# Patient Record
Sex: Male | Born: 1965 | Race: Black or African American | Hispanic: No | Marital: Single | Smoking: Former smoker
Health system: Southern US, Community
[De-identification: ages and names within clinical notes are randomized; demographics above are authoritative.]

## PROBLEM LIST (undated history)

## (undated) DIAGNOSIS — E119 Type 2 diabetes mellitus without complications: Secondary | ICD-10-CM

---

## 2016-08-08 ENCOUNTER — Other Ambulatory Visit: Payer: Self-pay

## 2016-08-08 ENCOUNTER — Encounter (HOSPITAL_COMMUNITY): Payer: Self-pay | Admitting: Nurse Practitioner

## 2016-08-08 ENCOUNTER — Emergency Department (HOSPITAL_COMMUNITY)
Admission: EM | Admit: 2016-08-08 | Discharge: 2016-08-08 | Disposition: A | Discharge status: Home / Self Care | Payer: Medicaid Other | Attending: Emergency Medicine | Admitting: Emergency Medicine

## 2016-08-08 DIAGNOSIS — Z87891 Personal history of nicotine dependence: Secondary | ICD-10-CM | POA: Diagnosis not present

## 2016-08-08 DIAGNOSIS — R739 Hyperglycemia, unspecified: Secondary | ICD-10-CM

## 2016-08-08 DIAGNOSIS — E1165 Type 2 diabetes mellitus with hyperglycemia: Secondary | ICD-10-CM | POA: Insufficient documentation

## 2016-08-08 DIAGNOSIS — Z794 Long term (current) use of insulin: Secondary | ICD-10-CM | POA: Insufficient documentation

## 2016-08-08 DIAGNOSIS — E860 Dehydration: Secondary | ICD-10-CM | POA: Diagnosis not present

## 2016-08-08 DIAGNOSIS — R42 Dizziness and giddiness: Secondary | ICD-10-CM | POA: Diagnosis present

## 2016-08-08 HISTORY — DX: Type 2 diabetes mellitus without complications: E11.9

## 2016-08-08 LAB — URINALYSIS, ROUTINE W REFLEX MICROSCOPIC
Bacteria, UA: NONE SEEN
Bilirubin Urine: NEGATIVE
Glucose, UA: >=500 mg/dL — AB
Hgb urine dipstick: NEGATIVE
KETONES UR: NEGATIVE mg/dL
Leukocytes, UA: NEGATIVE
Nitrite: NEGATIVE
PH: 6 (ref 5.0–8.0)
PROTEIN: NEGATIVE mg/dL
SQUAMOUS EPITHELIAL / LPF: NONE SEEN
Specific Gravity, Urine: 1.017 (ref 1.005–1.030)

## 2016-08-08 LAB — CBC
HCT: 34.8 % — ABNORMAL LOW (ref 39.0–52.0)
Hemoglobin: 11.3 g/dL — ABNORMAL LOW (ref 13.0–17.0)
MCH: 25.3 pg — AB (ref 26.0–34.0)
MCHC: 32.5 g/dL (ref 30.0–36.0)
MCV: 77.9 fL — AB (ref 78.0–100.0)
PLATELETS: 223 10*3/uL (ref 150–400)
RBC: 4.47 MIL/uL (ref 4.22–5.81)
RDW: 13.3 % (ref 11.5–15.5)
WBC: 4 10*3/uL (ref 4.0–10.5)

## 2016-08-08 LAB — BASIC METABOLIC PANEL
Anion gap: 10 (ref 5–15)
BUN: 16 mg/dL (ref 6–20)
CALCIUM: 9.2 mg/dL (ref 8.9–10.3)
CHLORIDE: 96 mmol/L — AB (ref 101–111)
CO2: 27 mmol/L (ref 22–32)
CREATININE: 1.13 mg/dL (ref 0.61–1.24)
GFR calc non Af Amer: >60 mL/min (ref >60)
Glucose, Bld: 427 mg/dL — ABNORMAL HIGH (ref 65–99)
Potassium: 4.1 mmol/L (ref 3.5–5.1)
Sodium: 133 mmol/L — ABNORMAL LOW (ref 135–145)

## 2016-08-08 LAB — CBG MONITORING, ED
GLUCOSE-CAPILLARY: 253 mg/dL — AB (ref 65–99)
Glucose-Capillary: 397 mg/dL — ABNORMAL HIGH (ref 65–99)

## 2016-08-08 MED ORDER — LACTATED RINGERS IV BOLUS (SEPSIS)
1000.0000 mL | Freq: Once | INTRAVENOUS | Status: AC
  Administered 2016-08-08: 1000 mL via INTRAVENOUS

## 2016-08-08 NOTE — ED Triage Notes (Signed)
Pt arrived via EMS from home with dizziness x2 days. 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 Now he has random dizzy spells. States he knows he has hx of orthostatic hypotension at times. Also DM which is well controlled per patient. Denies any chest pain, pain at all, actual syncope, or neuro defecits.

## 2016-08-08 NOTE — ED Provider Notes (Signed)
[facility]-EMERGENCY DEPT Provider Note   CSN: [id number] Arrival date & time: 08/08/16  1140     History   Chief Complaint Chief Complaint  Patient presents with  . Dizziness    HPI Brent Fleming is a 51 y.o. male.  51 year old male with past history of diabetes on insulin also orthostatic hypotension presents to the emergency department today with lightheadedness. Patient states that he works outside has been working harder and outside more than normal and he has always had issues with dehydration and he feels that this was happening this time. Over the last 24-36 hours he has had worsening lightheadedness and increased urination as well. Feels that these are related to position changes. There is no chest pain, back pain, shortness of breath, leg pain, leg swelling, palpitations or other symptoms at this time. Other symptoms. No excess removed factors except for movement. No other associated symptoms.    Dizziness    Past Medical History:  Diagnosis Date  . Diabetes mellitus without complication (HCC)     There are no active problems to display for this patient.   No past surgical history on file.     Home Medications    Prior to Admission medications   Not on File    Family History Family History  Problem Relation Age of Onset  . Hyperlipidemia Mother   . Hypertension Mother     Social History Social History  Substance Use Topics  . Smoking status: Former Games developer  . Smokeless tobacco: Never Used  . 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   Review of Systems Review of Systems  Neurological: Positive for dizziness.  All other systems reviewed and are negative.    Physical Exam Updated Vital Signs BP (!) 177/106 (BP Location: Left Arm)   Pulse 90   Temp 97.8 F (36.6 C) (Oral)   Resp 20   Ht 5\' 11"  (1.803 m)   Wt 68 kg (150 lb)   SpO2 100%   BMI 20.92 kg/m     Physical Exam  Constitutional: He is oriented to person, place, and time. He appears well-developed and well-nourished.  HENT:  Head: Normocephalic and atraumatic.  Eyes: Conjunctivae and EOM are normal.  Neck: Normal range of motion.  Cardiovascular: Tachycardia present.   Pulmonary/Chest: Effort normal. No respiratory distress.  Abdominal: Soft. He exhibits no distension.  Musculoskeletal: Normal range of motion.  Neurological: He is alert and oriented to person, place, and time. No cranial nerve deficit. Coordination normal.  Skin: Skin is warm and dry. No rash noted. No erythema.  Nursing note and vitals reviewed.    ED Treatments / Results  Labs (all labs ordered are listed, but only abnormal results are displayed) Labs Reviewed  BASIC METABOLIC PANEL - Abnormal; Notable for the following:       Result Value   Sodium 133 (*)    Chloride 96 (*)    Glucose, Bld 427 (*)    All other components within normal limits  CBC - Abnormal; Notable for the following:    Hemoglobin 11.3 (*)    HCT 34.8 (*)    MCV 77.9 (*)    MCH 25.3 (*)    All other components within normal limits  URINALYSIS, ROUTINE W REFLEX MICROSCOPIC - Abnormal; Notable for the following:    Glucose, UA >=500 (*)    All other  components within normal limits  CBG MONITORING, ED - Abnormal; Notable for the following:    Glucose-Capillary 397 (*)    All other components within normal limits  CBG MONITORING, ED - Abnormal; Notable for the following:    Glucose-Capillary 253 (*)    All other components within normal limits    EKG  EKG Interpretation  Date/Time:  Wednesday August 08 2016 12:01:14 EDT Ventricular Rate:  93 PR Interval:    QRS Duration: 96 QT Interval:  359 QTC Calculation: 447 R Axis:   26 Text Interpretation:  Age not entered, assumed to be  51 years old for purpose of ECG interpretation Sinus rhythm ST elev, probable normal early repol pattern No old tracing to compare Confirmed by  Marily MemosMesner, Diantha Paxson [phone]([id number]) on 08/08/2016 12:22:23 PM       Radiology No results found.  Procedures Procedures (including critical care time)  Medications Ordered in ED Medications  lactated ringers bolus 1,000 mL (0 mLs Intravenous Stopped 08/08/16 1352)  lactated ringers bolus 1,000 mL (0 mLs Intravenous Stopped 08/08/16 1606)     Initial Impression / Assessment and Plan / ED Course  I have reviewed the triage vital signs and the nursing notes.  Pertinent labs & imaging results that were available during my care of the patient were reviewed by me and considered in my medical decision making (see chart for details).    Suspect dehydration. ECG w/ what appears BER. No old tracing to compare but no e/o PE/ACS/arrhythmia as cause for symptoms.  Will check labs. Will hydrate.   After hydration, glucose improving. Patient feels better. Will plan for close pcp follow up, otherwise stable for discharge.   Final Clinical Impressions(s) / ED Diagnoses   Final diagnoses:  Dehydration  Hyperglycemia    New Prescriptions There are no discharge medications for this patient.    Marily MemosMesner, Leslee Haueter, MD 08/09/16 2149

## 2016-08-08 NOTE — ED Notes (Signed)
Per patient he just checked his blood sugar and it was 358 so he took 7 units of Novolog.

## 2016-08-08 NOTE — ED Notes (Signed)
Urine request made 

## 2016-08-08 NOTE — ED Notes (Signed)
2nd request for urine made patient given a urinal

## 2016-08-08 NOTE — ED Notes (Signed)
Bed: RESA Expected date:  Expected time:  Means of arrival:  Comments: EMS near-syncope

## 2016-08-30 ENCOUNTER — Emergency Department (HOSPITAL_COMMUNITY)
Admission: EM | Admit: 2016-08-30 | Discharge: 2016-08-31 | Disposition: A | Discharge status: Home / Self Care | Payer: Medicaid Other | Attending: Emergency Medicine | Admitting: Emergency Medicine

## 2016-08-30 ENCOUNTER — Emergency Department (HOSPITAL_COMMUNITY): Payer: Medicaid Other

## 2016-08-30 ENCOUNTER — Encounter (HOSPITAL_COMMUNITY): Payer: Self-pay | Admitting: Emergency Medicine

## 2016-08-30 DIAGNOSIS — R700 Elevated erythrocyte sedimentation rate: Secondary | ICD-10-CM | POA: Insufficient documentation

## 2016-08-30 DIAGNOSIS — M79672 Pain in left foot: Secondary | ICD-10-CM | POA: Diagnosis present

## 2016-08-30 DIAGNOSIS — E08621 Diabetes mellitus due to underlying condition with foot ulcer: Secondary | ICD-10-CM

## 2016-08-30 DIAGNOSIS — Z794 Long term (current) use of insulin: Secondary | ICD-10-CM | POA: Diagnosis not present

## 2016-08-30 DIAGNOSIS — Z87891 Personal history of nicotine dependence: Secondary | ICD-10-CM | POA: Diagnosis not present

## 2016-08-30 DIAGNOSIS — L97521 Non-pressure chronic ulcer of other part of left foot limited to breakdown of skin: Secondary | ICD-10-CM | POA: Diagnosis not present

## 2016-08-30 LAB — COMPREHENSIVE METABOLIC PANEL
ALK PHOS: 88 U/L (ref 38–126)
ALT: 15 U/L — AB (ref 17–63)
AST: 15 U/L (ref 15–41)
Albumin: 3.6 g/dL (ref 3.5–5.0)
Anion gap: 9 (ref 5–15)
BUN: 19 mg/dL (ref 6–20)
CALCIUM: 9.4 mg/dL (ref 8.9–10.3)
CHLORIDE: 99 mmol/L — AB (ref 101–111)
CO2: 28 mmol/L (ref 22–32)
CREATININE: 1.02 mg/dL (ref 0.61–1.24)
Glucose, Bld: 190 mg/dL — ABNORMAL HIGH (ref 65–99)
Potassium: 4.2 mmol/L (ref 3.5–5.1)
Sodium: 136 mmol/L (ref 135–145)
Total Bilirubin: 0.3 mg/dL (ref 0.3–1.2)
Total Protein: 8.1 g/dL (ref 6.5–8.1)

## 2016-08-30 LAB — CBC WITH DIFFERENTIAL/PLATELET
BASOS ABS: 0 10*3/uL (ref 0.0–0.1)
Basophils Relative: 1 %
Eosinophils Absolute: 0.1 10*3/uL (ref 0.0–0.7)
Eosinophils Relative: 1 %
HEMATOCRIT: 32.8 % — AB (ref 39.0–52.0)
HEMOGLOBIN: 11 g/dL — AB (ref 13.0–17.0)
LYMPHS ABS: 2.4 10*3/uL (ref 0.7–4.0)
LYMPHS PCT: 55 %
MCH: 25.8 pg — AB (ref 26.0–34.0)
MCHC: 33.5 g/dL (ref 30.0–36.0)
MCV: 76.8 fL — AB (ref 78.0–100.0)
Monocytes Absolute: 0.4 10*3/uL (ref 0.1–1.0)
Monocytes Relative: 9 %
NEUTROS ABS: 1.5 10*3/uL — AB (ref 1.7–7.7)
NEUTROS PCT: 34 %
PLATELETS: 218 10*3/uL (ref 150–400)
RBC: 4.27 MIL/uL (ref 4.22–5.81)
RDW: 12.9 % (ref 11.5–15.5)
WBC: 4.4 10*3/uL (ref 4.0–10.5)

## 2016-08-30 LAB — URINALYSIS, ROUTINE W REFLEX MICROSCOPIC
BILIRUBIN URINE: NEGATIVE
Glucose, UA: 50 mg/dL — AB
HGB URINE DIPSTICK: NEGATIVE
KETONES UR: NEGATIVE mg/dL
Leukocytes, UA: NEGATIVE
NITRITE: NEGATIVE
PH: 6 (ref 5.0–8.0)
Protein, ur: NEGATIVE mg/dL
SPECIFIC GRAVITY, URINE: 1.016 (ref 1.005–1.030)

## 2016-08-30 LAB — I-STAT CG4 LACTIC ACID, ED: LACTIC ACID, VENOUS: 0.63 mmol/L (ref 0.5–1.9)

## 2016-08-30 LAB — CBG MONITORING, ED: GLUCOSE-CAPILLARY: 218 mg/dL — AB (ref 65–99)

## 2016-08-30 NOTE — ED Triage Notes (Signed)
Patient reports that he gets recurrent calluses on bottom of left foot. About 4 days ago wearing dress sock and rubbing on lateral side of left foot causing pus blister that has since popped.  Patient reports that callus skin has grown over the pus blister and feels that dead skin needs to be removed and pus drained.  Patient reports that he does has some drainage when he wears a shoe.

## 2016-08-30 NOTE — ED Notes (Signed)
Patient aware of urine sample. 

## 2016-08-30 NOTE — ED Provider Notes (Signed)
[facility]-EMERGENCY DEPT Provider Note   CSN: [id number] Arrival date & time: 08/30/16  1802     History   Chief Complaint Chief Complaint  Patient presents with  . possible foot infection    HPI Brent Fleming is a 51 y.o. male with a hx of IDDM (poorly managed despite medication) presents to the Emergency Department complaining of gradual, persistent, progressively worsening pain and swelling at the site of a callus present for months.  Pt reports 4 days ago he developed a blister at the site that ruptured and drained purulent fluid.   Associated symptoms include persistent bleeding at the site.  Pt reports he normally keeps the callus pared down.  Nothing makes it better and nothing makes it worse.  Pt denies pain at the site.  Pt denies fever, chills, headache, neck pain, chest pain, SOB, abd pain, N/V/D, weakness, dizziness, syncope.   CBG runs between 100-300 normally.  Does not have a PCP or podiatrist.     The history is provided by the patient and medical records. No language interpreter was used.    Past Medical History:  Diagnosis Date  . Diabetes mellitus without complication (HCC)     There are no active problems to display for this patient.   History reviewed. No pertinent surgical history.     Home Medications    Prior to Admission medications   Medication Sig Start Date End Date Taking? Authorizing Provider  ibuprofen (ADVIL,MOTRIN) 200 MG tablet Take 200 mg by mouth every 6 (six) hours as needed for headache or mild pain.   Yes [provider]  LANTUS SOLOSTAR 100 UNIT/ML Solostar Pen Inject 32 micro curies into the skin daily. 07/10/16  Yes [provider]  NOVOLOG FLEXPEN 100 UNIT/ML FlexPen Inject 0-10 Units into the skin 3 (three) times daily. Sliding scale 07/10/16  Yes [provider]  cephALEXin (KEFLEX) 500 MG capsule Take 1 capsule (500 mg total) by mouth 4 (four) times daily. 08/31/16   Johnpaul Gillentine, Dahlia Client, PA-C    Family  History Family History  Problem Relation Age of Onset  . Hyperlipidemia Mother   . Hypertension Mother     Social History Social History  Substance Use Topics  . Smoking status: Former Games developer  . Smokeless tobacco: Never Used  . 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   Review of Systems Review of Systems  Constitutional: Negative for appetite change, diaphoresis, fatigue, fever and unexpected weight change.  HENT: Negative for mouth sores.   Eyes: Negative for visual disturbance.  Respiratory: Negative for cough, chest tightness, shortness of breath and wheezing.   Cardiovascular: Negative for chest pain.  Gastrointestinal: Negative for abdominal pain, constipation, diarrhea, nausea and vomiting.  Endocrine: Negative for polydipsia, polyphagia and polyuria.  Genitourinary: Negative for dysuria, frequency, hematuria and urgency.  Musculoskeletal: Negative for back pain and neck stiffness.  Skin: Positive for wound. Negative for rash.  Allergic/Immunologic: Negative for immunocompromised state.  Neurological: Negative for syncope, light-headedness and headaches.  Hematological: Does not bruise/bleed easily.  Psychiatric/Behavioral: Negative for sleep disturbance. The patient is not nervous/anxious.   All other systems reviewed and are negative.    Physical Exam Updated Vital Signs BP (!) 133/94 (BP Location: Left Arm)   Pulse 95   Temp 98.2 F (36.8 C) (Oral)   Resp 20   Ht 5\' 11"  (1.803 m)  Wt 70.3 kg (155 lb)   SpO2 100%   BMI 21.62 kg/m   Physical Exam  Constitutional: He appears well-developed and well-nourished. No distress.  Awake, alert, nontoxic appearance  HENT:  Head: Normocephalic and atraumatic.  Mouth/Throat: Oropharynx is clear and moist. No oropharyngeal exudate.  Eyes: Conjunctivae are normal. No scleral icterus.  Neck: Normal range of motion. Neck supple.    Cardiovascular: Normal rate, regular rhythm and intact distal pulses.   Pulmonary/Chest: Effort normal and breath sounds normal. No respiratory distress. He has no wheezes.  Equal chest expansion  Abdominal: Soft. Bowel sounds are normal. He exhibits no mass. There is no tenderness. There is no rebound and no guarding.  Musculoskeletal: Normal range of motion. He exhibits no edema.  Left lateral foot with ulceration and large area of dead skin; oozing blood and some purulent drainage.  Neurological: He is alert.  Speech is clear and goal oriented Moves extremities without ataxia  Skin: Skin is warm and dry. He is not diaphoretic.  Psychiatric: He has a normal mood and affect.  Nursing note and vitals reviewed.    ED Treatments / Results  Labs (all labs ordered are listed, but only abnormal results are displayed) Labs Reviewed  CBC WITH DIFFERENTIAL/PLATELET - Abnormal; Notable for the following:       Result Value   Hemoglobin 11.0 (*)    HCT 32.8 (*)    MCV 76.8 (*)    MCH 25.8 (*)    Neutro Abs 1.5 (*)    All other components within normal limits  COMPREHENSIVE METABOLIC PANEL - Abnormal; Notable for the following:    Chloride 99 (*)    Glucose, Bld 190 (*)    ALT 15 (*)    All other components within normal limits  SEDIMENTATION RATE - Abnormal; Notable for the following:    Sed Rate 52 (*)    All other components within normal limits  URINALYSIS, ROUTINE W REFLEX MICROSCOPIC - Abnormal; Notable for the following:    Glucose, UA 50 (*)    All other components within normal limits  CBG MONITORING, ED - Abnormal; Notable for the following:    Glucose-Capillary 218 (*)    All other components within normal limits  I-STAT CG4 LACTIC ACID, ED    Radiology Dg Foot Complete Left  Result Date: 08/30/2016 CLINICAL DATA:  Left foot ulceration EXAM: LEFT FOOT - COMPLETE 3+ VIEW COMPARISON:  None. FINDINGS: There is soft tissue ulceration at the lateral surface of the left  foot, nearest the fifth metatarsophalangeal joint. There is no osteolysis or other acute osseous abnormality. IMPRESSION: Soft tissue ulcer at the lateral aspect of the left foot near the fifth metatarsophalangeal joint without evidence of osteomyelitis. Electronically Signed   By: Deatra RobinsonKevin  Herman M.D.   On: 08/30/2016 23:15    Procedures Debridement Date/Time: 08/31/2016 12:00 AM Performed by: Dierdre ForthMUTHERSBAUGH, Amen Staszak Authorized by: Dierdre ForthMUTHERSBAUGH, Leyland Kenna  Consent: Verbal consent obtained. Risks and benefits: risks, benefits and alternatives were discussed Consent given by: patient Site marked: the operative site was marked Imaging studies: imaging studies available Required items: required blood products, implants, devices, and special equipment available Patient identity confirmed: verbally with patient and arm band Time out: Immediately prior to procedure a "time out" was called to verify the correct patient, procedure, equipment, support staff and site/side marked as required. Preparation: Patient was prepped and draped in the usual sterile fashion. Local anesthesia used: no  Anesthesia: Local anesthesia used: no  Sedation: Patient sedated: no  Patient tolerance: Patient tolerated the procedure well with no immediate complications Comments: Minor debridement of the diabetic ulcer to the left lateral and plantar surface of the distal foot.    (including critical care time)  Medications Ordered in ED Medications - No data to display   Initial Impression / Assessment and Plan / ED Course  I have reviewed the triage vital signs and the nursing notes.  Pertinent labs & imaging results that were available during my care of the patient were reviewed by me and considered in my medical decision making (see chart for details).     Pt presents with diabetic foot wound.  He demands that it be debrided. He has no systemic symptoms of infection.  Vital signs without fever, tachycardia or  hypotension.  No leukocytosis or elevated lactic acid.  CBG elevated at 218.  Slightly elevated Sed rate.  UA without ketones.  Normal AG, no evidence of DKA.  Doubt osteomyelitis.  Pt will be d/c home with foot care instructions, keflex and referral to podiatry.  Also discussed reasons to return to the ED.  Pt states understanding and is in agreement with the plan.    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     Jayshawn Colston, Boyd Kerbs 08/31/16 0024    Little, Ambrose Finland, MD 08/31/16 [phone]

## 2016-08-31 LAB — SEDIMENTATION RATE: Sed Rate: 52 mm/hr — ABNORMAL HIGH (ref 0–16)

## 2016-08-31 MED ORDER — CEPHALEXIN 500 MG PO CAPS: 500.0000 mg | ORAL_CAPSULE | Freq: Four times a day (QID) | ORAL | 0 refills | Status: DC

## 2016-08-31 NOTE — Discharge Instructions (Signed)
1. Medications: Keflex, usual home medications 2. Treatment: rest, drink plenty of fluids,  3. Follow Up: Please followup with podiatry in 3 days for discussion of your diagnoses and further evaluation after today's visit; if you do not have a primary care doctor use the resource guide provided to find one; Please return to the ER for worsening infection, fever, worsening drainage or other concerns

## 2016-09-03 ENCOUNTER — Ambulatory Visit (INDEPENDENT_AMBULATORY_CARE_PROVIDER_SITE_OTHER): Payer: Medicaid Other | Admitting: Podiatry

## 2016-09-03 DIAGNOSIS — E0842 Diabetes mellitus due to underlying condition with diabetic polyneuropathy: Secondary | ICD-10-CM

## 2016-09-03 DIAGNOSIS — L97522 Non-pressure chronic ulcer of other part of left foot with fat layer exposed: Secondary | ICD-10-CM | POA: Diagnosis not present

## 2016-09-03 DIAGNOSIS — I70245 Atherosclerosis of native arteries of left leg with ulceration of other part of foot: Secondary | ICD-10-CM | POA: Diagnosis not present

## 2016-09-03 MED ORDER — GENTAMICIN SULFATE 0.1 % EX CREA: 1.0000 "application " | TOPICAL_CREAM | Freq: Three times a day (TID) | CUTANEOUS | 0 refills | Status: AC

## 2016-09-03 MED ORDER — SULFAMETHOXAZOLE-TRIMETHOPRIM 800-160 MG PO TABS: 1.0000 | ORAL_TABLET | Freq: Two times a day (BID) | ORAL | 0 refills | Status: DC

## 2016-09-03 NOTE — Progress Notes (Signed)
Patient ID: Brent Fleming, male   DOB: [birth year], 51 y.o.   MRN: [id number]   Subjective:  Patient with a history of diabetes mellitus presents today for evaluation of a wound to the plantar aspect of the left foot. The wound is been going on for proximal he 1 week now. Patient states that he worsened tightfitting shoes and developed a blister on the outside of his foot. The blister eventually turned into an ulcer and he presented to the emergency department and was referred here for follow-up treatment and evaluation. X-rays performed in the emergency department were negative for any obvious sign of osteomyelitis.   Objective/Physical Exam General: The patient is alert and oriented x3 in no acute distress.  Dermatology:  Wound #1 noted to the sub-fifth MPJ left foot measuring approximately 2.02.00.2 cm (LxWxD).   To the noted ulceration(s), there is no eschar. There is a moderate amount of slough, fibrin, and necrotic tissue noted. Granulation tissue and wound base is red. There is a minimal amount of serosanguineous drainage noted. There is no exposed bone muscle-tendon ligament or joint. There is no malodor. Periwound integrity is macerated. Skin is warm, dry and supple bilateral lower extremities.  Vascular: Palpable pedal pulses bilaterally. No edema or erythema noted. Capillary refill within normal limits.  Neurological: Epicritic and protective threshold absent bilaterally.   Musculoskeletal Exam: Range of motion within normal limits to all pedal and ankle joints bilateral. Muscle strength 5/5 in all groups bilateral.   Assessment: #1 left plantar forefoot ulceration secondary to diabetes mellitus #2 diabetes mellitus w/ peripheral neuropathy   Plan of Care:  #1 Patient was evaluated. #2 medically necessary excisional debridement including muscle and deep fascial tissue was performed using a tissue nipper and a chisel blade. Excisional debridement of all the necrotic nonviable  tissue down to healthy bleeding viable tissue was performed with post-debridement measurements same as pre-. #3 the wound was cleansed and dry sterile dressing applied. #4 today cultures were taken and sent to pathology for culture and sensitivity #5 prescription for Bactrim DS #6 prescription for gentamicin cream #7 return to clinic in 2 weeks   Felecia ShellingBrent M. Zeek Rostron, DPM [facility]  Dr. Felecia ShellingBrent M. Allura Doepke, DPM    [address]                                        [place], [place] [address]                Office [phone]  Fax [phone]

## 2016-09-03 NOTE — Progress Notes (Signed)
   Subjective:    Patient ID: Brent Fleming, male    DOB: 19-Oct-1965, 51 y.o.   MRN: [id number]  HPI  I have had a callus for years it became a blister that busted about a week ago and now I have a wound.  I went to the ER on Thursday they did x-rays and started me on antibiotics.  I have been cleaning with peroxide, dressing and using a shoe from a previous occurecnce  I am diabetic.    Review of Systems  All other systems reviewed and are negative.      Objective:   Physical Exam        Assessment & Plan:

## 2016-09-06 LAB — WOUND CULTURE
GRAM STAIN: NONE SEEN
GRAM STAIN: NONE SEEN
Gram Stain: NONE SEEN

## 2016-09-17 ENCOUNTER — Ambulatory Visit (INDEPENDENT_AMBULATORY_CARE_PROVIDER_SITE_OTHER): Payer: Medicaid Other | Admitting: Podiatry

## 2016-09-17 DIAGNOSIS — E0842 Diabetes mellitus due to underlying condition with diabetic polyneuropathy: Secondary | ICD-10-CM

## 2016-09-17 DIAGNOSIS — I70245 Atherosclerosis of native arteries of left leg with ulceration of other part of foot: Secondary | ICD-10-CM | POA: Diagnosis not present

## 2016-09-17 DIAGNOSIS — L97522 Non-pressure chronic ulcer of other part of left foot with fat layer exposed: Secondary | ICD-10-CM | POA: Diagnosis not present

## 2016-09-17 NOTE — Progress Notes (Signed)
Patient ID: Brent KanarisWillie Bertsch, male   DOB: [birth year], 51 y.o.   MRN: [id number]   Subjective:  Patient with a history of diabetes mellitus presents today for follow-up evaluation of an ulcer to the left plantar forefoot. Patient believes the ulcer is doing much better. He still has proximal 1 day worth of antibiotics. He is been dressing the ulcer daily and wearing the offloading Darco shoe.   Objective/Physical Exam General: The patient is alert and oriented x3 in no acute distress.  Dermatology:  Wound #1 noted to the sub-fifth MPJ left foot measuring approximately 1.01.00.1 cm (LxWxD).   To the noted ulceration(s), there is no eschar. There is a moderate amount of slough, fibrin, and necrotic tissue noted. Granulation tissue and wound base is red. There is a minimal amount of serosanguineous drainage noted. There is no exposed bone muscle-tendon ligament or joint. There is no malodor. Periwound integrity is macerated. Skin is warm, dry and supple bilateral lower extremities.  Vascular: Palpable pedal pulses bilaterally. No edema or erythema noted. Capillary refill within normal limits.  Neurological: Epicritic and protective threshold absent bilaterally.   Musculoskeletal Exam: Range of motion within normal limits to all pedal and ankle joints bilateral. Muscle strength 5/5 in all groups bilateral.   Assessment: #1 left plantar forefoot ulceration secondary to diabetes mellitus #2 diabetes mellitus w/ peripheral neuropathy   Plan of Care:  #1 Patient was evaluated. Culture results taken last visit were reviewed today. Negative for infection. #2 medically necessary excisional debridement including subcutaneous tissue was performed using a tissue nipper and a chisel blade. Excisional debridement of all the necrotic nonviable tissue down to healthy bleeding viable tissue was performed with post-debridement measurements same as pre-. #3 the wound was cleansed and dry sterile dressing  applied. #4 continue daily application of gentamicin cream with dry sterile dressing #5 continue offloading Darco shoe #6 return to clinic in 2 weeks   Felecia ShellingBrent M. Findlay Dagher, DPM [facility]  Dr. Felecia ShellingBrent M. Briea Mcenery, DPM    [address]                                        [place], [place] [address]                Office [phone]  Fax [phone]

## 2016-09-24 ENCOUNTER — Ambulatory Visit (HOSPITAL_COMMUNITY)
Admission: EM | Admit: 2016-09-24 | Discharge: 2016-09-24 | Disposition: A | Discharge status: Home / Self Care | Payer: Medicaid Other | Attending: Emergency Medicine | Admitting: Emergency Medicine

## 2016-09-24 ENCOUNTER — Encounter (HOSPITAL_COMMUNITY): Payer: Self-pay | Admitting: *Deleted

## 2016-09-24 DIAGNOSIS — S91209A Unspecified open wound of unspecified toe(s) with damage to nail, initial encounter: Secondary | ICD-10-CM

## 2016-09-24 DIAGNOSIS — S91201A Unspecified open wound of right great toe with damage to nail, initial encounter: Secondary | ICD-10-CM | POA: Diagnosis not present

## 2016-09-24 DIAGNOSIS — S90122A Contusion of left lesser toe(s) without damage to nail, initial encounter: Secondary | ICD-10-CM | POA: Diagnosis not present

## 2016-09-24 MED ORDER — CEPHALEXIN 500 MG PO CAPS: 500.0000 mg | ORAL_CAPSULE | Freq: Four times a day (QID) | ORAL | 0 refills | Status: AC

## 2016-09-24 NOTE — Discharge Instructions (Signed)
Keep the bed of the toenail clean. You may soak in warm salt water or irrigate with water from the tub. Watch for any signs of infection such as increased redness, swelling, drainage of pus. Apply your gentamicin ointment for the next couple of days. After that he will need to apply. Allow it to dry. Keep it covered so it will not get dirty or infected. Take the antibiotic for the first 2 days for prevention of infection since you have diabetes. May also apply ice to the toe as needed for swelling.

## 2016-09-24 NOTE — ED Provider Notes (Signed)
[facility]    CSN: [id number] Arrival date & time: 09/24/16  1909     History   Chief Complaint Chief Complaint  Patient presents with  . Toe Injury    HPI Brent Fleming is a 51 y.o. male.   51 year old male stubbed her left toe on the bed foot approximately one hour prior to arrival. This produced an approximately 90% nail avulsion. There is minor swelling to the distal phalanx. Minor tenderness.      Past Medical History:  Diagnosis Date  . Diabetes mellitus without complication (HCC)     There are no active problems to display for this patient.   History reviewed. No pertinent surgical history.     Home Medications    Prior to Admission medications   Medication Sig Start Date End Date Taking? Authorizing Provider  cephALEXin (KEFLEX) 500 MG capsule Take 1 capsule (500 mg total) by mouth 4 (four) times daily. 09/24/16   Hayden RasmussenMabe, Detric Scalisi, NP  gentamicin cream (GARAMYCIN) 0.1 % Apply 1 application topically 3 (three) times daily. 09/03/16   Felecia ShellingEvans, Brent M, DPM  ibuprofen (ADVIL,MOTRIN) 200 MG tablet Take 200 mg by mouth every 6 (six) hours as needed for headache or mild pain.    [provider]  LANTUS SOLOSTAR 100 UNIT/ML Solostar Pen Inject 32 micro curies into the skin daily. 07/10/16   [provider]  NOVOLOG FLEXPEN 100 UNIT/ML FlexPen Inject 0-10 Units into the skin 3 (three) times daily. Sliding scale 07/10/16   [provider]    Family History Family History  Problem Relation Age of Onset  . Hyperlipidemia Mother   . Hypertension Mother     Social History Social History  Substance Use Topics  . Smoking status: Former Games developermoker  . Smokeless tobacco: Never Used  . 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   Review of Systems Review of Systems  Constitutional: Negative.   Skin: Positive for wound.  Neurological: Negative.     All other systems reviewed and are negative.    Physical Exam Triage Vital Signs ED Triage Vitals [09/24/16 2003]  Enc Vitals Group     BP (!) 154/60     Pulse Rate 78     Resp 18     Temp 98.6 F (37 C)     Temp Source Oral     SpO2 100 %     Weight      Height      Head Circumference      Peak Flow      Pain Score 2     Pain Loc      Pain Edu?      Excl. in GC?    No data found.   Updated Vital Signs BP (!) 154/60 (BP Location: Right Arm)   Pulse 78   Temp 98.6 F (37 C) (Oral)   Resp 18   SpO2 100%   Visual Acuity Right Eye Distance:   Left Eye Distance:   Bilateral Distance:    Right Eye Near:   Left Eye Near:    Bilateral Near:     Physical Exam  Constitutional: He is oriented to person, place, and time. He appears well-developed and well-nourished. No distress.  Eyes: EOM are normal.  Neck: Neck supple.  Cardiovascular: Normal rate.   Pulmonary/Chest: Effort normal. No respiratory distress.  Musculoskeletal:  He exhibits no edema or deformity.  The left second toe with near complete nail avulsion. The underlying bed intact, no laceration. The patient preferred to pull remainder of the nail off himself. No joint tenderness to the toe. No deformity. Distal neurovascular motor sensory is grossly intact.  Neurological: He is alert and oriented to person, place, and time. He exhibits normal muscle tone.  Skin: Skin is warm and dry.  Psychiatric: He has a normal mood and affect.  Nursing note and vitals reviewed.    UC Treatments / Results  Labs (all labs ordered are listed, but only abnormal results are displayed) Labs Reviewed - No data to display  EKG  EKG Interpretation None       Radiology No results found.  Procedures Procedures (including critical care time)  Medications Ordered in UC Medications - No data to display   Initial Impression / Assessment and Plan / UC Course  I have reviewed the triage vital signs and the nursing  notes.  Pertinent labs & imaging results that were available during my care of the patient were reviewed by me and considered in my medical decision making (see chart for details).     Keep the bed of the toenail clean. You may soak in warm salt water or irrigate with water from the tub. Watch for any signs of infection such as increased redness, swelling, drainage of pus. Apply your gentamicin ointment for the next couple of days. After that he will need to apply. Allow it to dry. Keep it covered so it will not get dirty or infected. Take the antibiotic for the first 2 days for prevention of infection since you have diabetes. May also apply ice to the toe as needed for swelling.   Final Clinical Impressions(s) / UC Diagnoses   Final diagnoses:  Contusion of second toe of left foot, initial encounter  Traumatic avulsion of nail plate of toe, initial encounter    New Prescriptions New Prescriptions   CEPHALEXIN (KEFLEX) 500 MG CAPSULE    Take 1 capsule (500 mg total) by mouth 4 (four) times daily.     Controlled Substance Prescriptions [place] Controlled Substance Registry consulted? Not Applicable   Hayden Rasmussen, NP 09/24/16 2045
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
## 2016-09-24 NOTE — ED Notes (Signed)
Foot cleaned and bandaid placed. Instructions given for home care by NP Mabe.

## 2016-10-01 ENCOUNTER — Ambulatory Visit (INDEPENDENT_AMBULATORY_CARE_PROVIDER_SITE_OTHER): Payer: Medicaid Other | Admitting: Podiatry

## 2016-10-01 ENCOUNTER — Encounter: Payer: Self-pay | Admitting: Podiatry

## 2016-10-01 DIAGNOSIS — I70245 Atherosclerosis of native arteries of left leg with ulceration of other part of foot: Secondary | ICD-10-CM

## 2016-10-01 DIAGNOSIS — E0842 Diabetes mellitus due to underlying condition with diabetic polyneuropathy: Secondary | ICD-10-CM

## 2016-10-01 DIAGNOSIS — L97522 Non-pressure chronic ulcer of other part of left foot with fat layer exposed: Secondary | ICD-10-CM

## 2016-10-01 NOTE — Progress Notes (Signed)
Patient ID: Brent Fleming, male   DOB: 1965/07/14, 51 y.o.   MRN: [id number]   Subjective:  Patient with a history of diabetes mellitus presents today for follow-up evaluation of an ulcer to the left plantar forefoot. Patient states that the ulcer continues to improve. He finished his oral antibiotics. He has been applying gentamicin cream and dressing the ulcer daily and wearing offloading Darco shoe.   Objective/Physical Exam General: The patient is alert and oriented x3 in no acute distress.  Dermatology:  Wound #1 noted to the sub-fifth MPJ left foot measuring approximately 0.60.7 0.1 cm (LxWxD).   To the noted ulceration(s), there is no eschar. There is a moderate amount of slough, fibrin, and necrotic tissue noted. Granulation tissue and wound base is red. There is a minimal amount of serosanguineous drainage noted. There is no exposed bone muscle-tendon ligament or joint. There is no malodor. Periwound integrity is macerated. Skin is warm, dry and supple bilateral lower extremities.  Vascular: Palpable pedal pulses bilaterally. No edema or erythema noted. Capillary refill within normal limits.  Neurological: Epicritic and protective threshold absent bilaterally.   Musculoskeletal Exam: Range of motion within normal limits to all pedal and ankle joints bilateral. Muscle strength 5/5 in all groups bilateral.   Assessment: #1 left plantar forefoot ulceration secondary to diabetes mellitus #2 diabetes mellitus w/ peripheral neuropathy   Plan of Care:  #1 Patient was evaluated.  #2 medically necessary excisional debridement including subcutaneous tissue was performed using a tissue nipper and a chisel blade. Excisional debridement of all the necrotic nonviable tissue down to healthy bleeding viable tissue was performed with post-debridement measurements same as pre-. #3 the wound was cleansed and dry sterile dressing applied. #4 continue daily application of gentamicin cream with dry  sterile dressing #5 continue offloading Darco shoe. Offloading donut pads were dispensed today #6 return to clinic in 2 weeks   Felecia Shelling, DPM [facility]  Dr. Felecia Shelling, DPM    [address]                                        [place], [place] [address]                Office [phone]  Fax [phone]

## 2016-10-22 ENCOUNTER — Ambulatory Visit (INDEPENDENT_AMBULATORY_CARE_PROVIDER_SITE_OTHER): Payer: Medicaid Other | Admitting: Podiatry

## 2016-10-22 ENCOUNTER — Encounter: Payer: Self-pay | Admitting: Podiatry

## 2016-10-22 DIAGNOSIS — L97522 Non-pressure chronic ulcer of other part of left foot with fat layer exposed: Secondary | ICD-10-CM

## 2016-10-22 DIAGNOSIS — I70245 Atherosclerosis of native arteries of left leg with ulceration of other part of foot: Secondary | ICD-10-CM

## 2016-10-22 DIAGNOSIS — E0842 Diabetes mellitus due to underlying condition with diabetic polyneuropathy: Secondary | ICD-10-CM

## 2016-10-26 NOTE — Progress Notes (Signed)
   HPI:   Past Medical History:  Diagnosis Date  . Diabetes mellitus without complication [facility])      Physical Exam: General: The patient is alert and oriented x3 in no acute distress.  Dermatology: Skin is warm, dry and supple bilateral lower extremities. Negative for open lesions or macerations.The ulceration site previously mentioned on the left plantar forefoot appears to be healed. There is some hyperkeratotic callus tissue noted however complete reepithelialization has occurred.  Vascular: Palpable pedal pulses bilaterally. No edema or erythema noted. Capillary refill within normal limits.  Neurological: Epicritic and protective threshold grossly intact bilaterally.   Musculoskeletal Exam: Range of motion within normal limits to all pedal and ankle joints bilateral. Muscle strength 5/5 in all groups bilateral.    Assessment: -  Healed left plantar forefoot ulceration secondary to diabetes mellitus - Pre-ulcerative callus lesion left   Plan of Care:  - Patient was evaluated today - Excisional debridement of the callus lesion was performed using a chisel blade without incident or bleeding - Recommended the patient were good supportive shoe gear -  Stressed the importance of maintaining healthy blood sugar levels and controlling diabetes -Return to clinic when necessary  Felecia Shelling, DPM [facility]  Dr. Felecia Shelling, DPM    [address] [place], [place] [address]                Office [phone]  Fax [phone]    She is trying red but

## 2016-12-06 ENCOUNTER — Emergency Department (HOSPITAL_COMMUNITY)
Admission: EM | Admit: 2016-12-06 | Discharge: 2016-12-06 | Disposition: A | Discharge status: Home / Self Care | Payer: No Typology Code available for payment source | Attending: Emergency Medicine | Admitting: Emergency Medicine

## 2016-12-06 ENCOUNTER — Encounter (HOSPITAL_COMMUNITY): Payer: Self-pay

## 2016-12-06 DIAGNOSIS — M7918 Myalgia, other site: Secondary | ICD-10-CM

## 2016-12-06 DIAGNOSIS — Y9389 Activity, other specified: Secondary | ICD-10-CM | POA: Insufficient documentation

## 2016-12-06 DIAGNOSIS — E119 Type 2 diabetes mellitus without complications: Secondary | ICD-10-CM | POA: Insufficient documentation

## 2016-12-06 DIAGNOSIS — M62830 Muscle spasm of back: Secondary | ICD-10-CM | POA: Diagnosis not present

## 2016-12-06 DIAGNOSIS — Y999 Unspecified external cause status: Secondary | ICD-10-CM | POA: Diagnosis not present

## 2016-12-06 DIAGNOSIS — Z79899 Other long term (current) drug therapy: Secondary | ICD-10-CM | POA: Insufficient documentation

## 2016-12-06 DIAGNOSIS — Z794 Long term (current) use of insulin: Secondary | ICD-10-CM | POA: Diagnosis not present

## 2016-12-06 DIAGNOSIS — Z87891 Personal history of nicotine dependence: Secondary | ICD-10-CM | POA: Diagnosis not present

## 2016-12-06 DIAGNOSIS — Y92410 Unspecified street and highway as the place of occurrence of the external cause: Secondary | ICD-10-CM | POA: Insufficient documentation

## 2016-12-06 DIAGNOSIS — M545 Low back pain: Secondary | ICD-10-CM | POA: Diagnosis present

## 2016-12-06 MED ORDER — IBUPROFEN 600 MG PO TABS: 600.0000 mg | ORAL_TABLET | Freq: Four times a day (QID) | ORAL | 0 refills | Status: AC | PRN

## 2016-12-06 MED ORDER — IBUPROFEN 200 MG PO TABS
600.0000 mg | ORAL_TABLET | Freq: Once | ORAL | Status: AC
  Administered 2016-12-06: 600 mg via ORAL
  Filled 2016-12-06: qty 3

## 2016-12-06 MED ORDER — CYCLOBENZAPRINE HCL 10 MG PO TABS
5.0000 mg | ORAL_TABLET | Freq: Once | ORAL | Status: AC
  Administered 2016-12-06: 5 mg via ORAL
  Filled 2016-12-06: qty 1

## 2016-12-06 MED ORDER — CYCLOBENZAPRINE HCL 10 MG PO TABS: 10.0000 mg | ORAL_TABLET | Freq: Two times a day (BID) | ORAL | 0 refills | Status: AC | PRN
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
## 2016-12-27 ENCOUNTER — Other Ambulatory Visit: Payer: Self-pay

## 2016-12-27 ENCOUNTER — Emergency Department (HOSPITAL_COMMUNITY)
Admission: EM | Admit: 2016-12-27 | Discharge: 2016-12-27 | Disposition: A | Discharge status: Home / Self Care | Payer: Medicaid Other | Attending: Physician Assistant | Admitting: Physician Assistant

## 2016-12-27 ENCOUNTER — Encounter (HOSPITAL_COMMUNITY): Payer: Self-pay | Admitting: *Deleted

## 2016-12-27 DIAGNOSIS — Y998 Other external cause status: Secondary | ICD-10-CM | POA: Diagnosis not present

## 2016-12-27 DIAGNOSIS — Z794 Long term (current) use of insulin: Secondary | ICD-10-CM | POA: Insufficient documentation

## 2016-12-27 DIAGNOSIS — Y929 Unspecified place or not applicable: Secondary | ICD-10-CM | POA: Insufficient documentation

## 2016-12-27 DIAGNOSIS — S0993XA Unspecified injury of face, initial encounter: Secondary | ICD-10-CM | POA: Insufficient documentation

## 2016-12-27 DIAGNOSIS — Y9389 Activity, other specified: Secondary | ICD-10-CM | POA: Diagnosis not present

## 2016-12-27 DIAGNOSIS — E119 Type 2 diabetes mellitus without complications: Secondary | ICD-10-CM | POA: Diagnosis not present

## 2016-12-27 DIAGNOSIS — Z87891 Personal history of nicotine dependence: Secondary | ICD-10-CM | POA: Insufficient documentation

## 2016-12-27 MED ORDER — ACETAMINOPHEN 500 MG PO TABS
1000.0000 mg | ORAL_TABLET | Freq: Once | ORAL | Status: AC
  Administered 2016-12-27: 1000 mg via ORAL
  Filled 2016-12-27: qty 2

## 2016-12-27 NOTE — ED Notes (Signed)
Pt verbalized understanding of discharge instructions and denies any further questions at this time.    Pt understands that he should go directly to dentist office

## 2016-12-27 NOTE — Discharge Instructions (Signed)
Go to the dentist office straight from the ER so they have time to see you before the office closes.
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
## 2016-12-27 NOTE — ED Notes (Signed)
ED Provider at bedside. 

## 2018-03-25 IMAGING — CR DG FOOT COMPLETE 3+V*L*
3 series · 3 of 3 positions shown · non-contrast
Comparison: None.

CLINICAL DATA: Left foot ulceration

EXAM:
LEFT FOOT - COMPLETE 3+ VIEW

[x foot ap left]
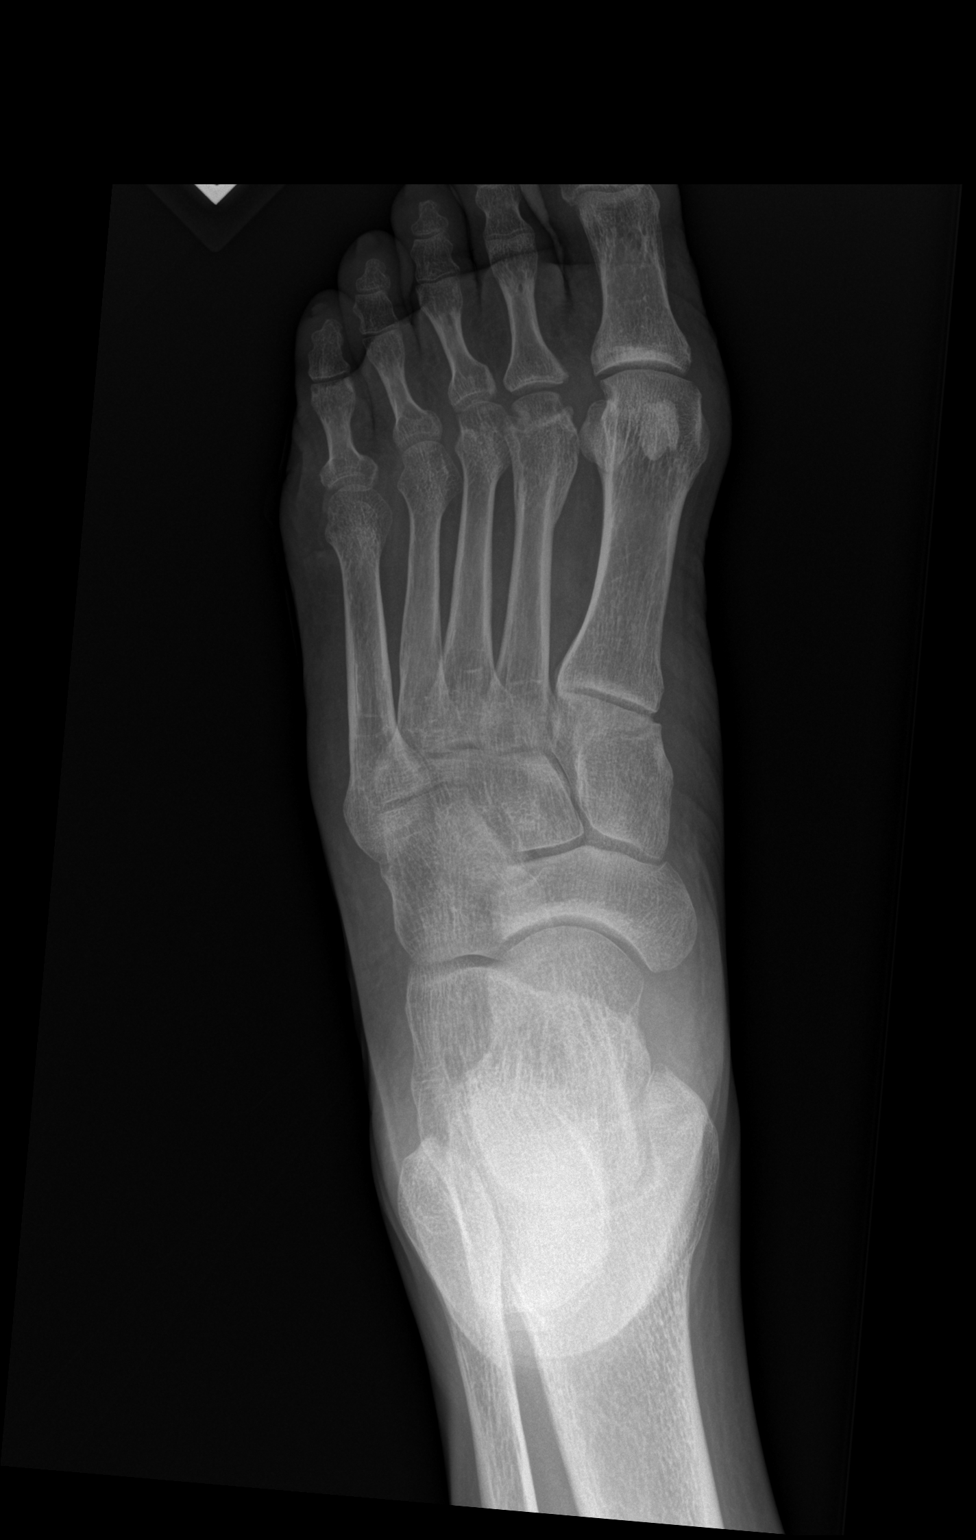

[x foot obl left]
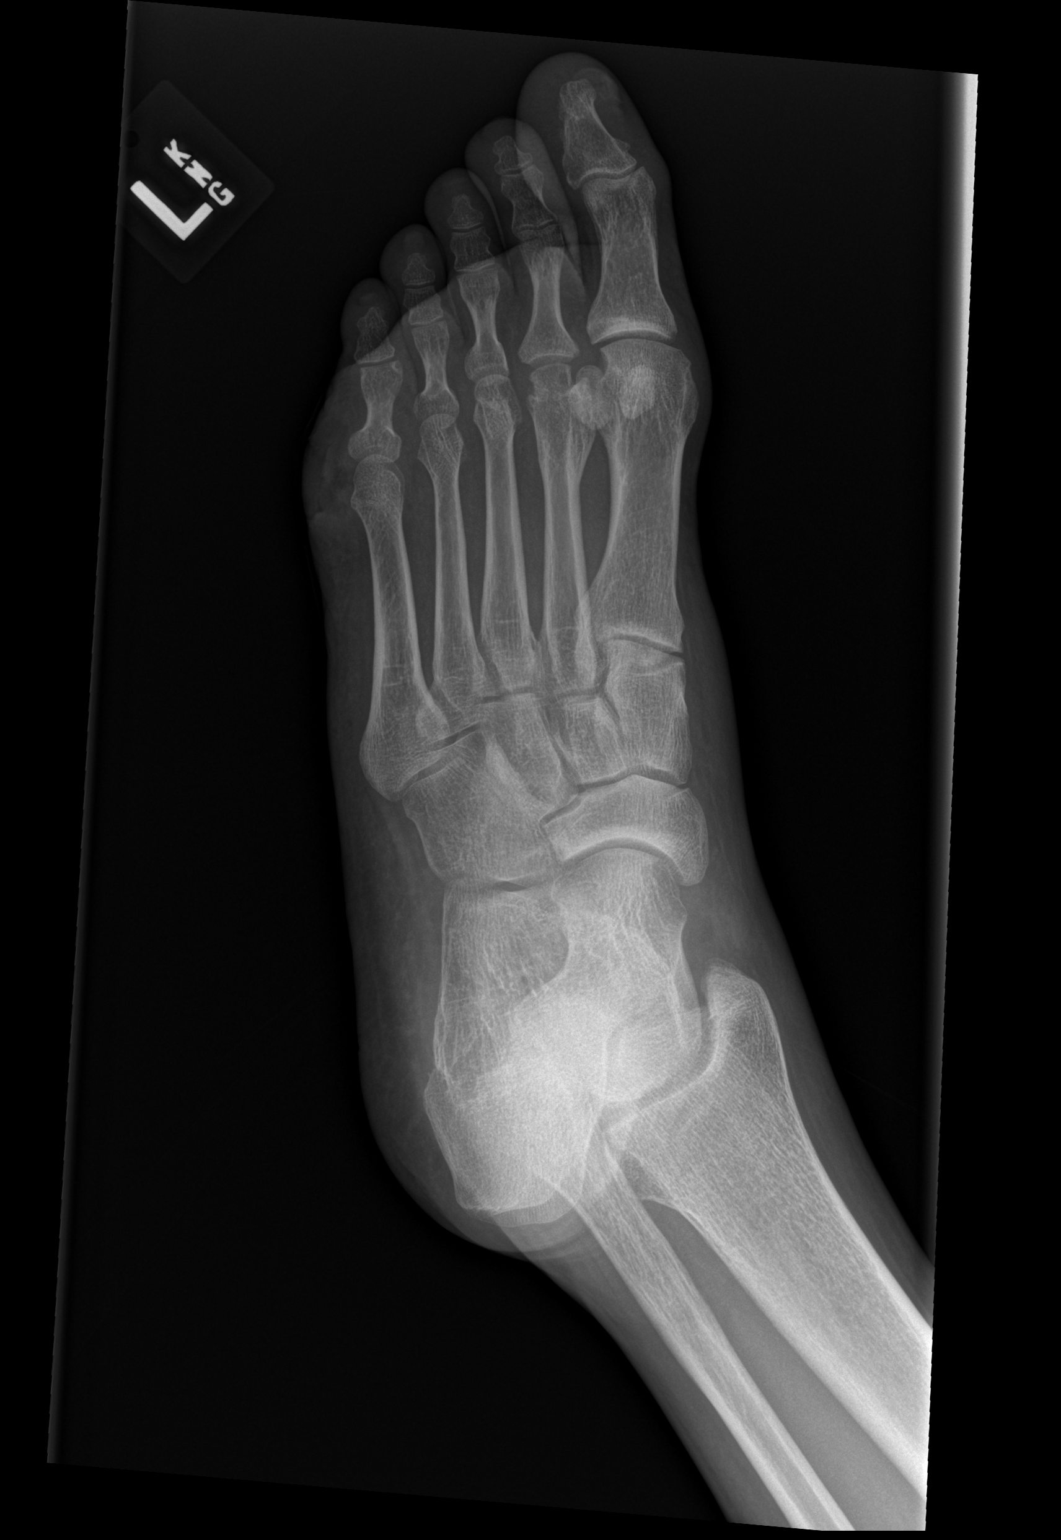

[x foot lat left]
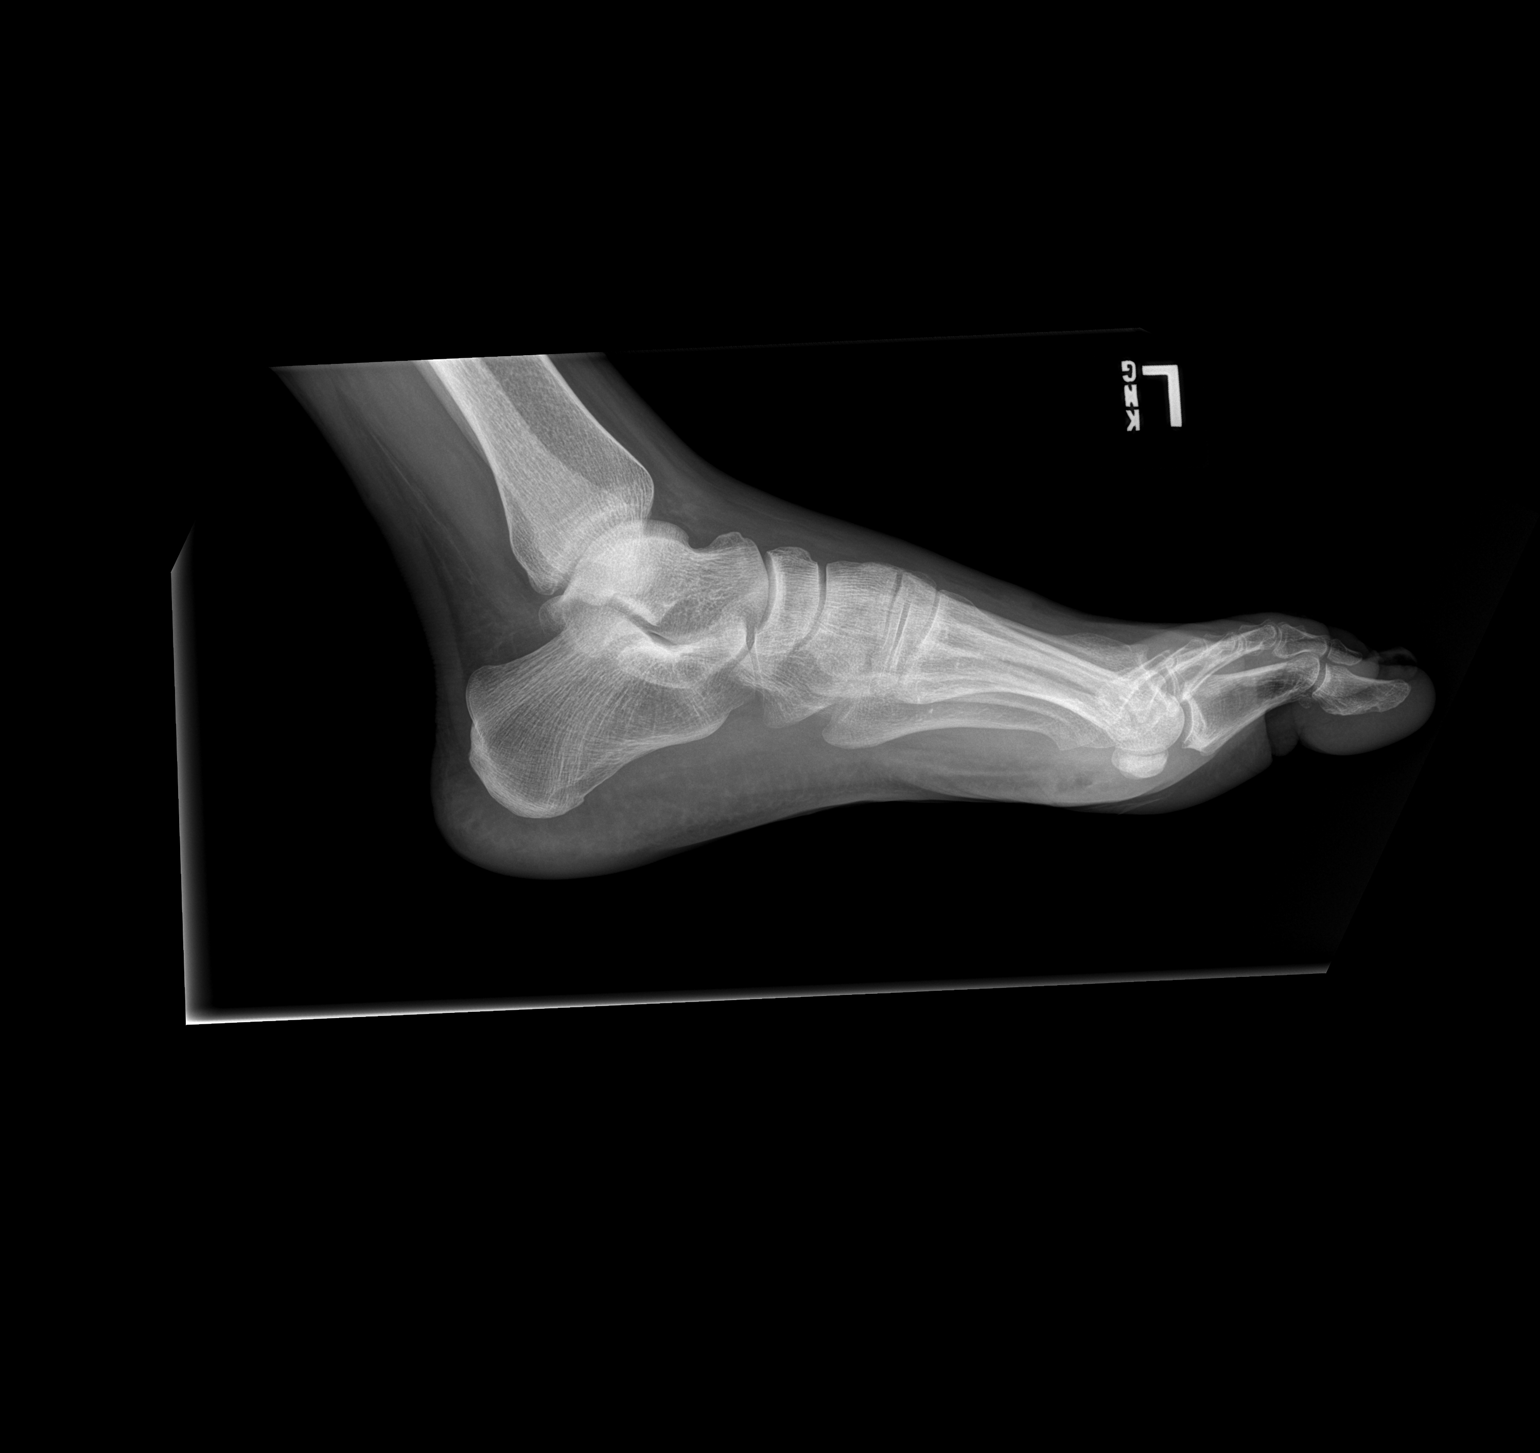

[3 of 3 positions shown; findings below may reference images not displayed]

FINDINGS: There is soft tissue ulceration at the lateral surface of the left
foot, nearest the fifth metatarsophalangeal joint. There is no
osteolysis or other acute osseous abnormality.
IMPRESSION: Soft tissue ulcer at the lateral aspect of the left foot near the
fifth metatarsophalangeal joint without evidence of osteomyelitis.
